# Patient Record
Sex: Female | Born: 1970 | Race: White | Hispanic: No | Marital: Married | Smoking: Former smoker
Health system: Southern US, Community
[De-identification: ages and names within clinical notes are randomized; demographics above are authoritative.]

## PROBLEM LIST (undated history)

## (undated) DIAGNOSIS — I82409 Acute embolism and thrombosis of unspecified deep veins of unspecified lower extremity: Secondary | ICD-10-CM

## (undated) DIAGNOSIS — F419 Anxiety disorder, unspecified: Secondary | ICD-10-CM

## (undated) HISTORY — DX: Acute embolism and thrombosis of unspecified deep veins of unspecified lower extremity: I82.409

## (undated) HISTORY — PX: CHOLECYSTECTOMY: SHX55

## (undated) HISTORY — DX: Anxiety disorder, unspecified: F41.9

---

## 1997-09-20 ENCOUNTER — Other Ambulatory Visit: Admission: RE | Admit: 1997-09-20 | Discharge: 1997-09-20 | Payer: Self-pay | Admitting: Obstetrics and Gynecology

## 1998-12-26 ENCOUNTER — Other Ambulatory Visit: Admission: RE | Admit: 1998-12-26 | Discharge: 1998-12-26 | Payer: Self-pay | Admitting: Obstetrics and Gynecology

## 2000-10-11 ENCOUNTER — Other Ambulatory Visit: Admission: RE | Admit: 2000-10-11 | Discharge: 2000-10-11 | Payer: Self-pay | Admitting: Obstetrics and Gynecology

## 2002-01-21 ENCOUNTER — Other Ambulatory Visit: Admission: RE | Admit: 2002-01-21 | Discharge: 2002-01-21 | Payer: Self-pay | Admitting: Obstetrics and Gynecology

## 2002-10-02 ENCOUNTER — Other Ambulatory Visit: Admission: RE | Admit: 2002-10-02 | Discharge: 2002-10-02 | Payer: Self-pay | Admitting: Obstetrics and Gynecology

## 2003-04-02 ENCOUNTER — Other Ambulatory Visit: Admission: RE | Admit: 2003-04-02 | Discharge: 2003-04-02 | Payer: Self-pay | Admitting: Obstetrics and Gynecology

## 2004-02-10 ENCOUNTER — Other Ambulatory Visit: Admission: RE | Admit: 2004-02-10 | Discharge: 2004-02-10 | Payer: Self-pay | Admitting: Obstetrics and Gynecology

## 2004-12-12 ENCOUNTER — Other Ambulatory Visit: Admission: RE | Admit: 2004-12-12 | Discharge: 2004-12-12 | Payer: Self-pay | Admitting: Obstetrics and Gynecology

## 2006-10-28 ENCOUNTER — Observation Stay: Payer: Self-pay | Admitting: General Surgery

## 2006-10-28 ENCOUNTER — Ambulatory Visit: Payer: Self-pay | Admitting: Internal Medicine

## 2007-12-12 ENCOUNTER — Ambulatory Visit: Payer: Self-pay | Admitting: Internal Medicine

## 2007-12-17 ENCOUNTER — Ambulatory Visit: Payer: Self-pay | Admitting: Internal Medicine

## 2007-12-17 ENCOUNTER — Inpatient Hospital Stay: Payer: Self-pay | Admitting: Internal Medicine

## 2007-12-31 ENCOUNTER — Ambulatory Visit: Payer: Self-pay | Admitting: Internal Medicine

## 2008-01-26 ENCOUNTER — Ambulatory Visit: Payer: Self-pay | Admitting: Internal Medicine

## 2008-03-16 ENCOUNTER — Ambulatory Visit: Payer: Self-pay | Admitting: Internal Medicine

## 2008-04-01 ENCOUNTER — Ambulatory Visit: Payer: Self-pay | Admitting: Internal Medicine

## 2008-05-02 ENCOUNTER — Ambulatory Visit: Payer: Self-pay | Admitting: Internal Medicine

## 2008-06-04 IMAGING — CR DG CHOLANGIOGRAM OPERATIVE
1 series · 2 of 2 positions shown · non-contrast
Comparison: none

REASON FOR EXAM: Post-op
COMMENTS:

[Series 1: view not recorded · 0.17mm/px · 2 of 2 slices shown]
[im 1/2]
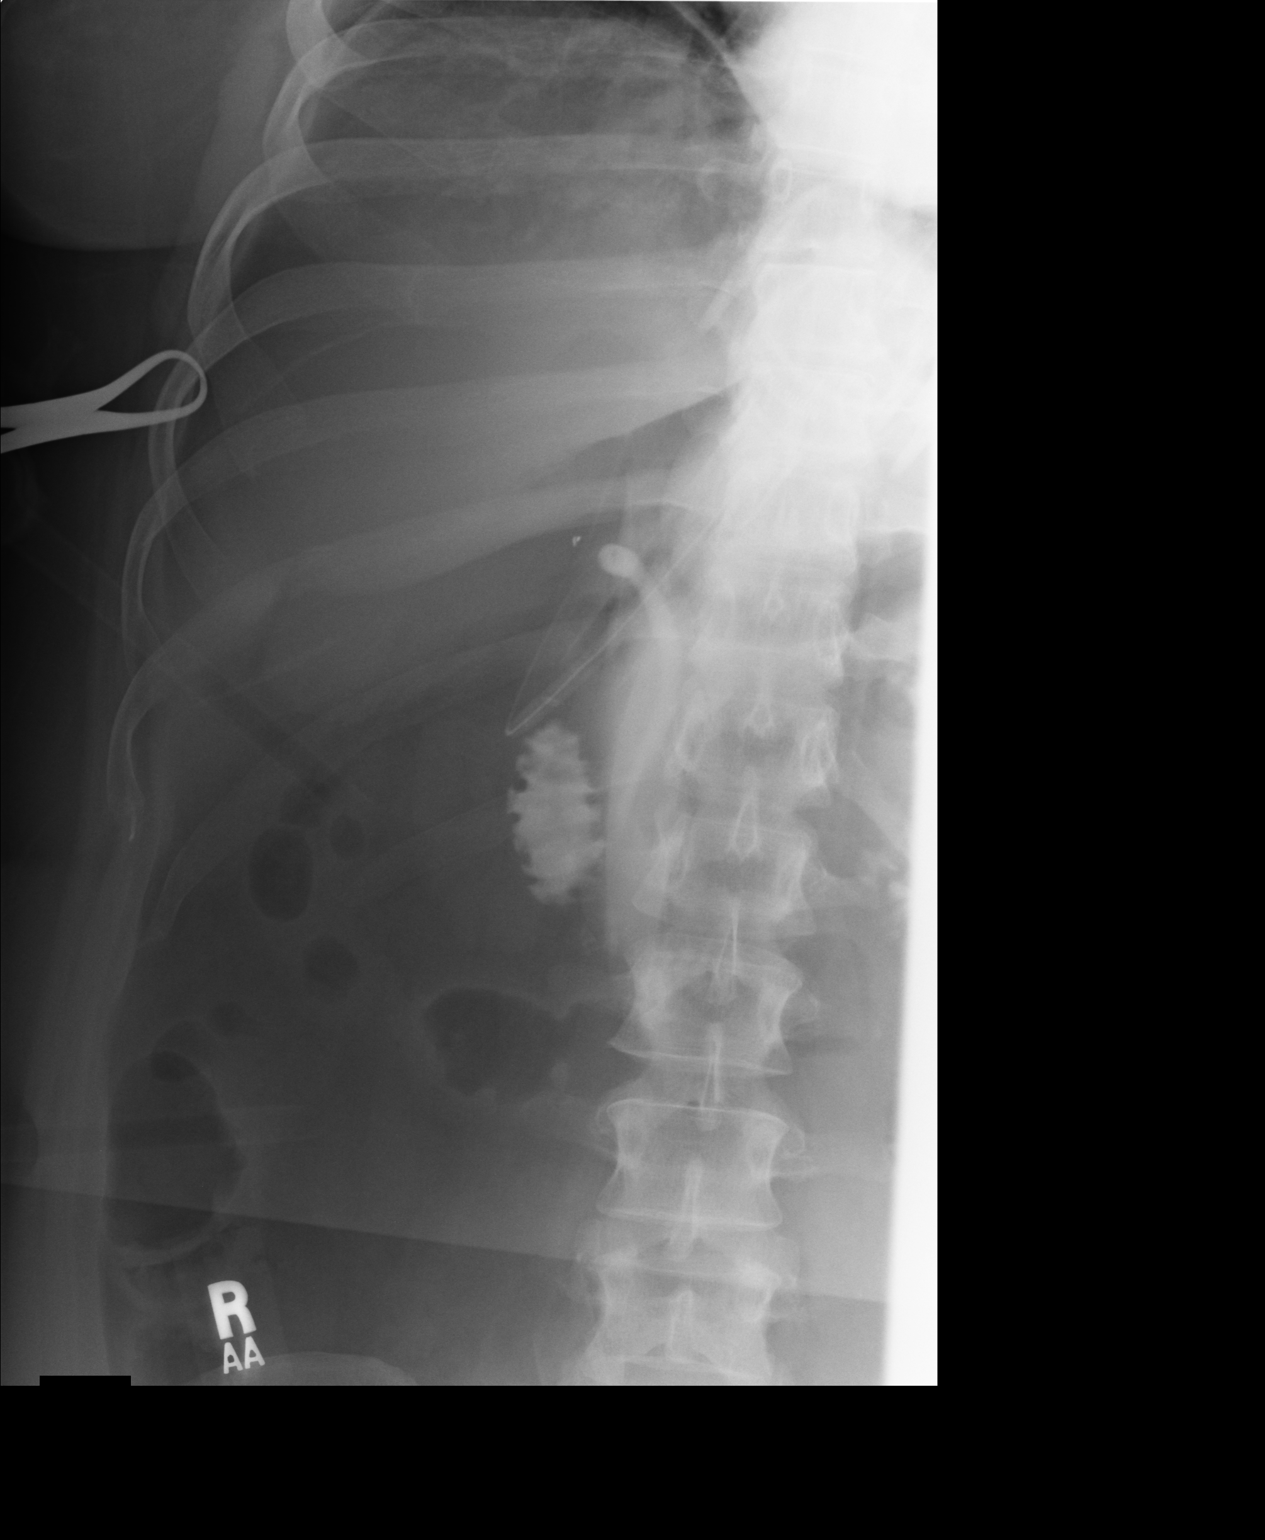
[im 2/2]
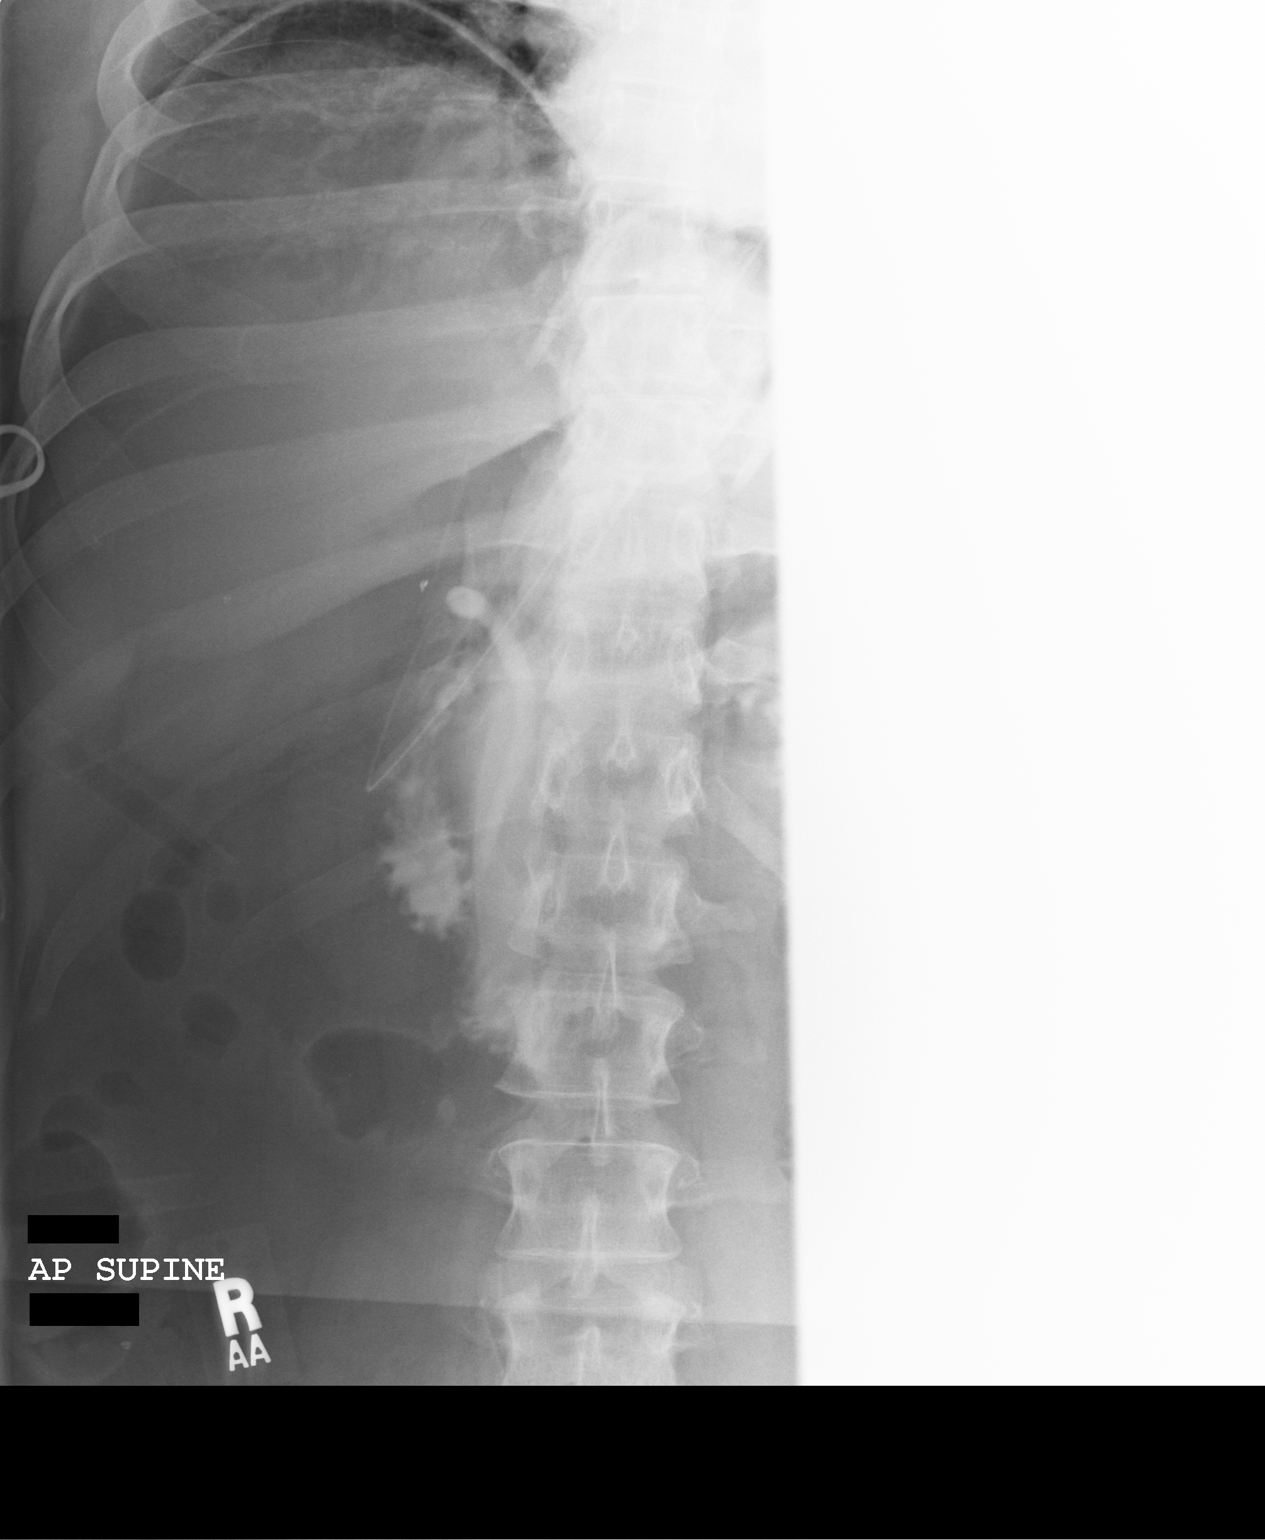

[2 of 2 positions shown; findings below may reference images not displayed]

PROCEDURE:     DXR - DXR CHOLANGIOGRAM OP (INITIAL)  - October 29, 2006 [DATE]

RESULT:     A view of the RIGHT abdomen demonstrates opacification of the
common bile duct with some contrast remaining in the duct and some in the
duodenum. No definite dilatation is seen. No filling defect is evident but
portions of the duct are poorly seen.
IMPRESSION: 1.Please see above.

## 2009-07-18 IMAGING — US US EXTREM LOW VENOUS*R*
1 series · 17 of 24 positions shown · non-contrast
Comparison: none

REASON FOR EXAM: pain swelling eval DVT
COMMENTS:

PROCEDURE:     US  - US DOPPLER LOW EXTR RIGHT  - December 12, 2007  [DATE]
RESULT:     Comparison: No available comparison exam.

[Series 1: us extrem low venous*right* · 17 of 25 slices shown]
[im 1/25]
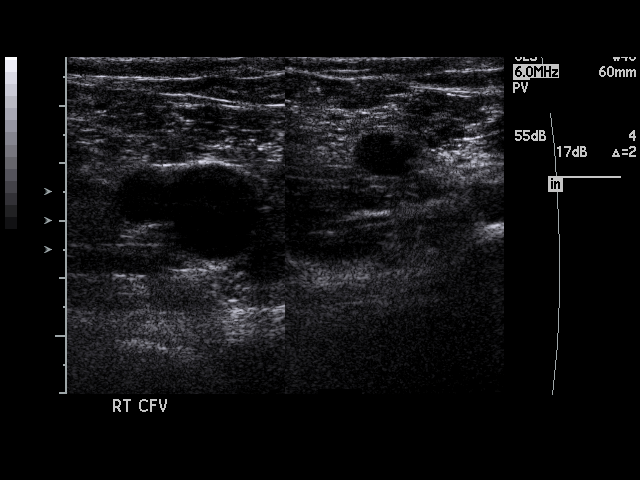
[im 3/25]
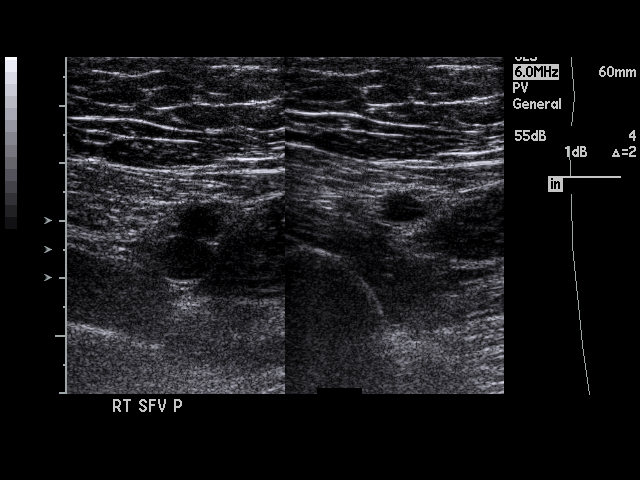
[im 4/25]
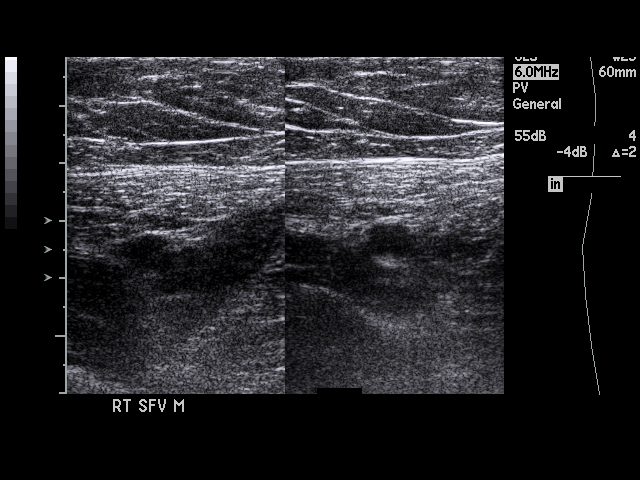
[im 5/25]
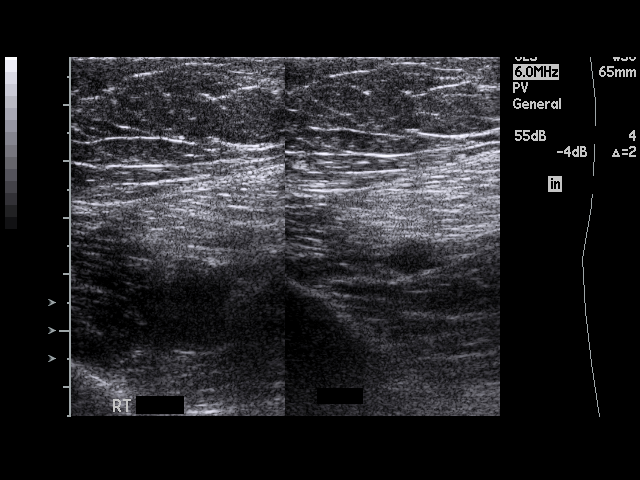
[im 7/25]
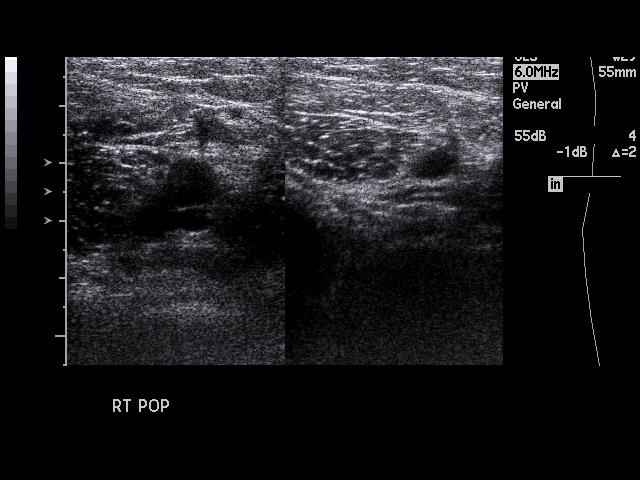
[im 8/25]
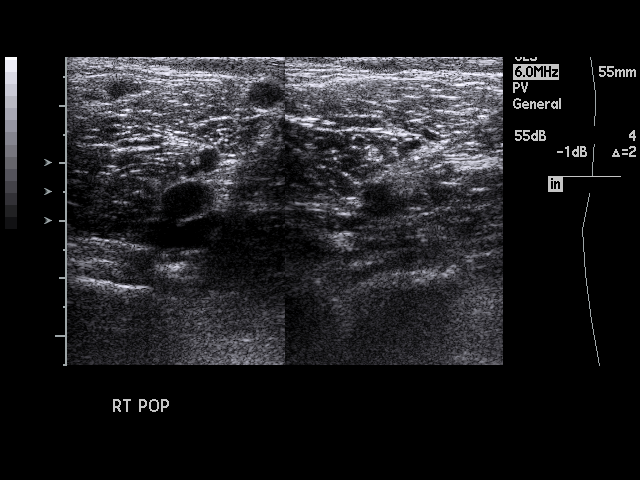
[im 10/25]
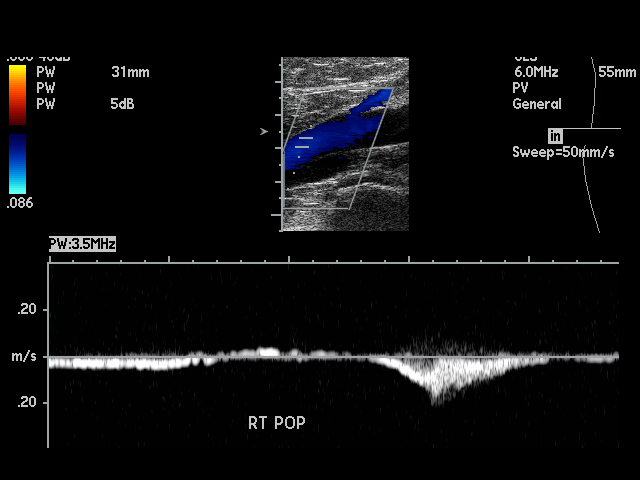
[im 11/25]
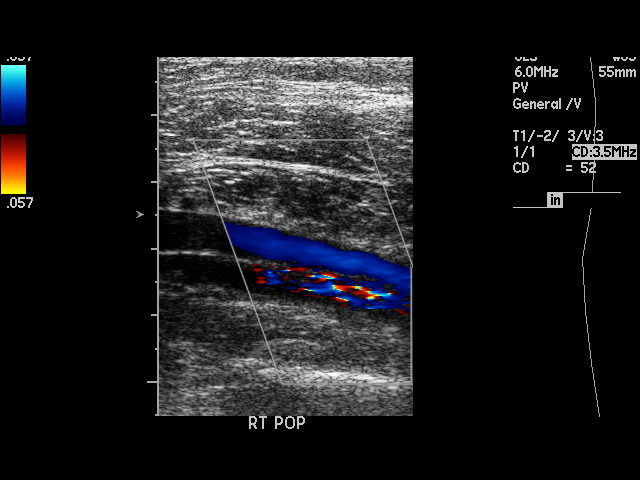
[im 13/25]
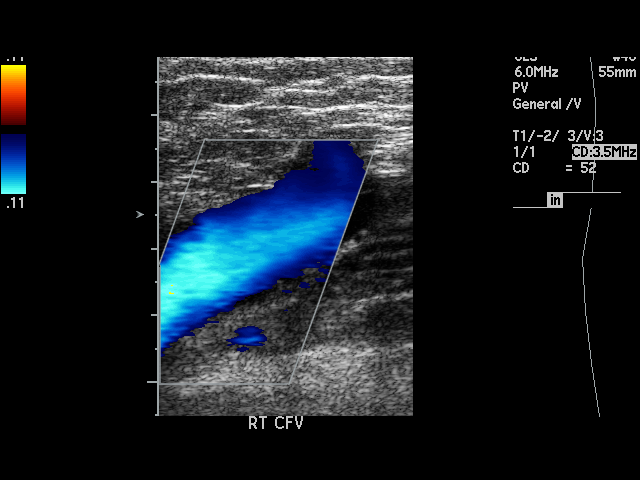
[im 14/25]
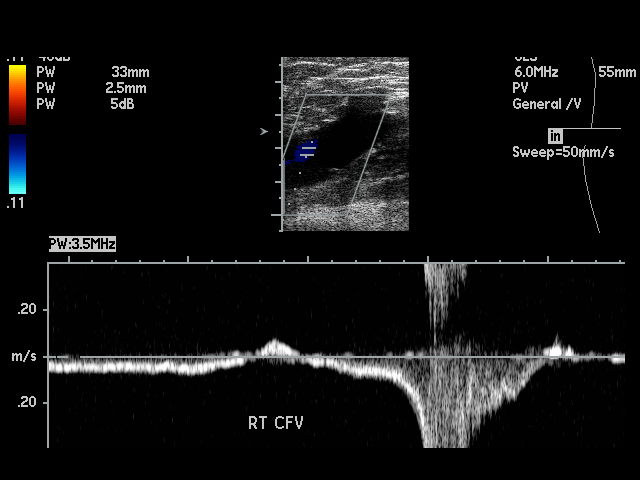
[im 15/25]
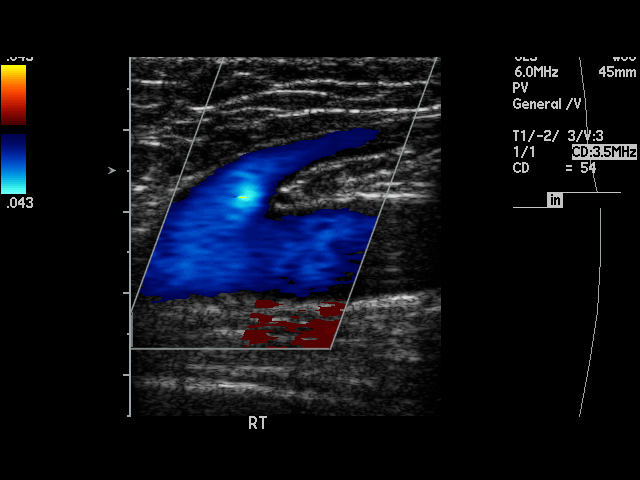
[im 17/25]
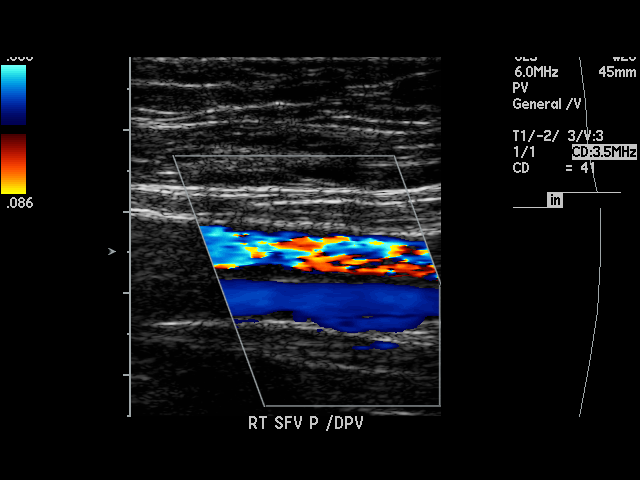
[im 18/25]
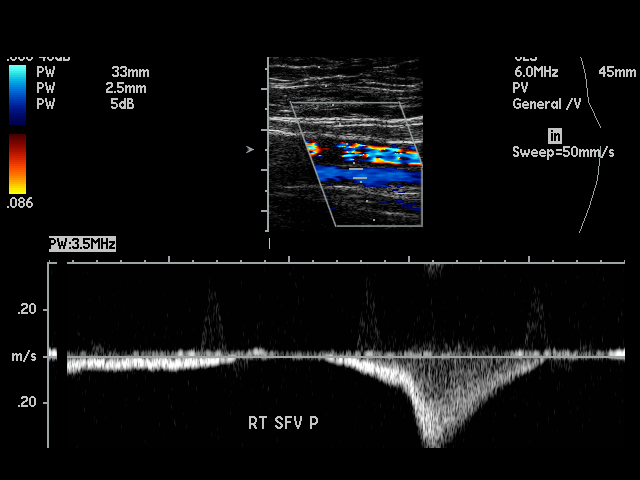
[im 20/25]
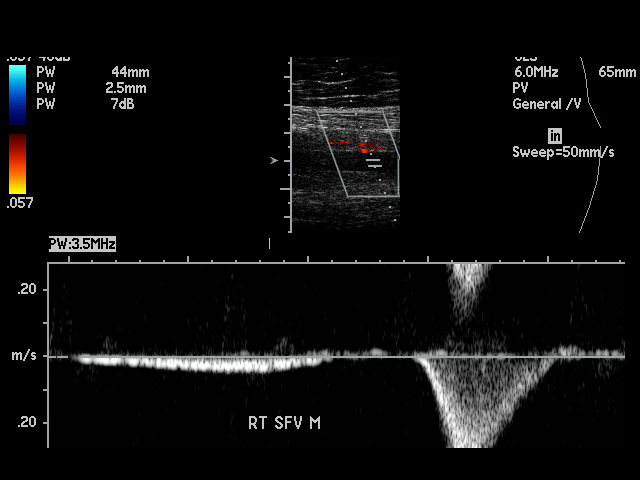
[im 21/25]
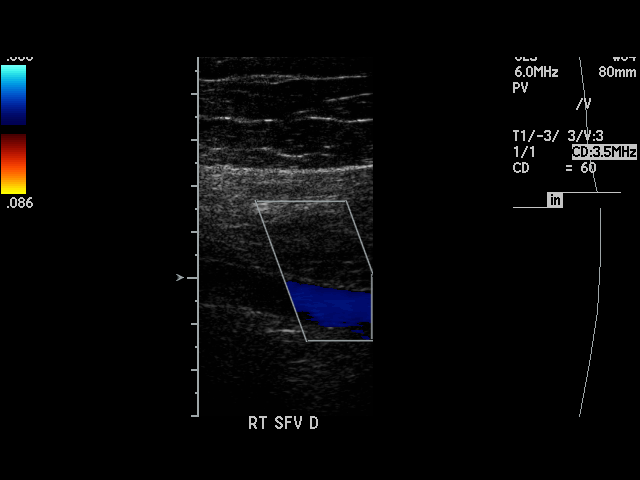
[im 22/25]
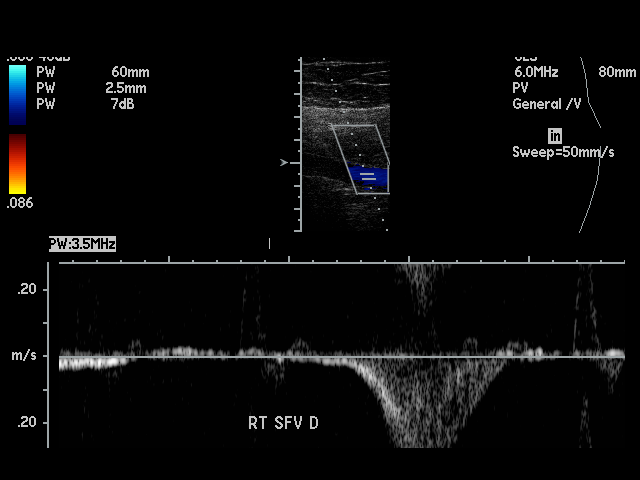
[im 25/25]
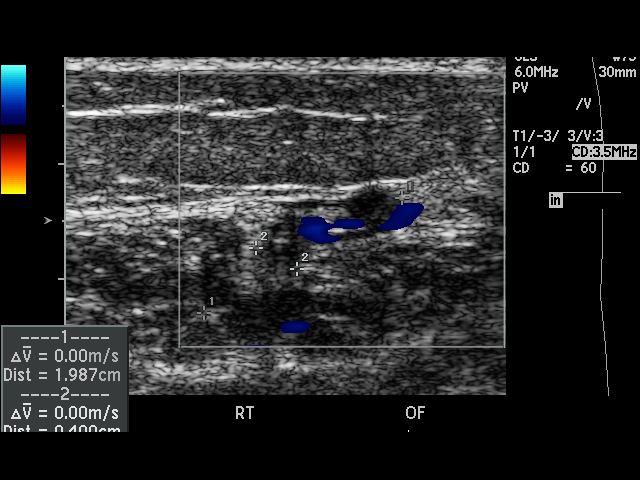

[17 of 24 positions shown; findings below may reference images not displayed]

FINDINGS: Venous Doppler examination of the right lower extremity was performed from
the groin to the popliteal vein. No evidence of DVT. There is nonocclusive
thrombus of a varicose vein in the medial posterior right upper calf at
patient's site of pain.
IMPRESSION: 1. No evidence of right lower extremity deep venous thrombosis. There is
nonocclusive thrombus of a varicose vein in the medial posterior right upper
calf at patient's site of pain.

## 2009-07-23 IMAGING — US US EXTREM LOW VENOUS*R*
1 series · 17 of 24 positions shown · non-contrast
Comparison: none

REASON FOR EXAM: Non-occulsive thrombus
COMMENTS:

[Series 1: us extrem low venous*right* · 17 of 37 slices shown]
[im 1/37]
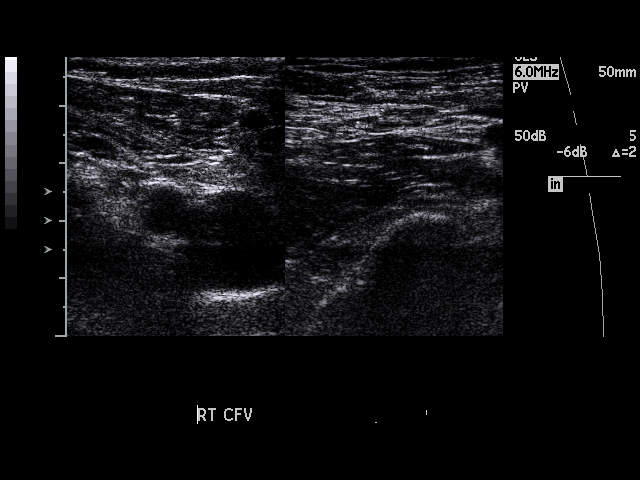
[im 4/37]
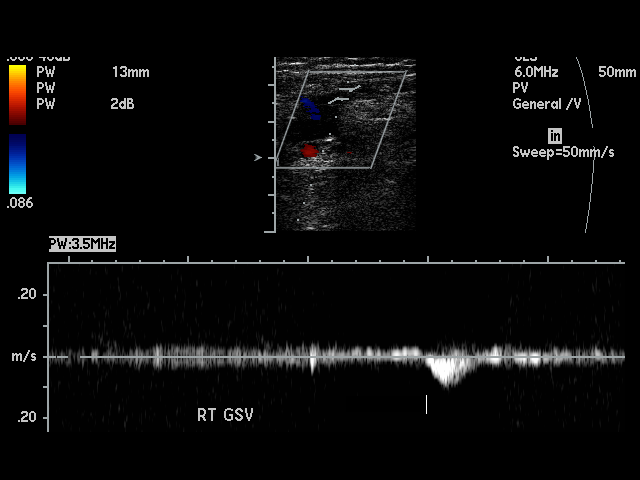
[im 5/37]
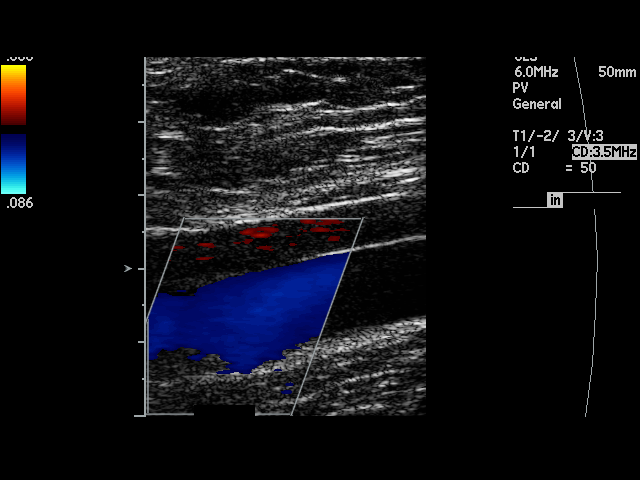
[im 7/37]
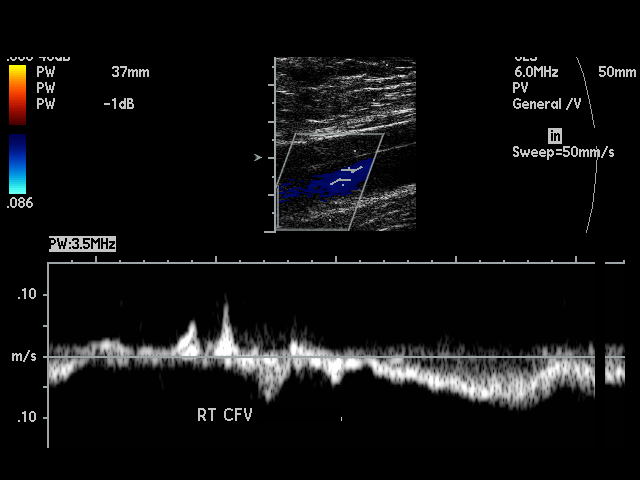
[im 10/37]
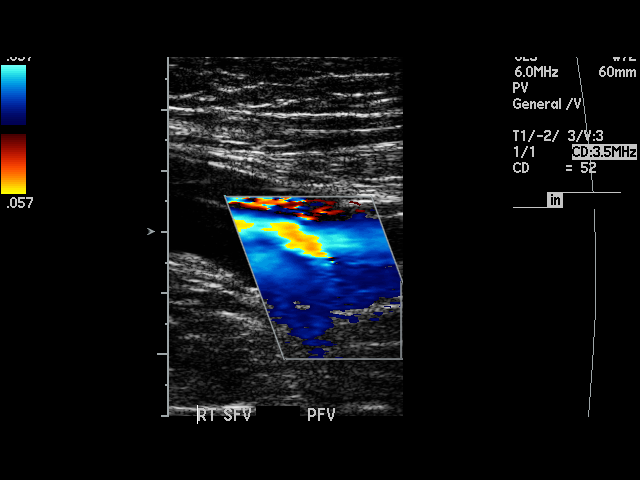
[im 11/37]
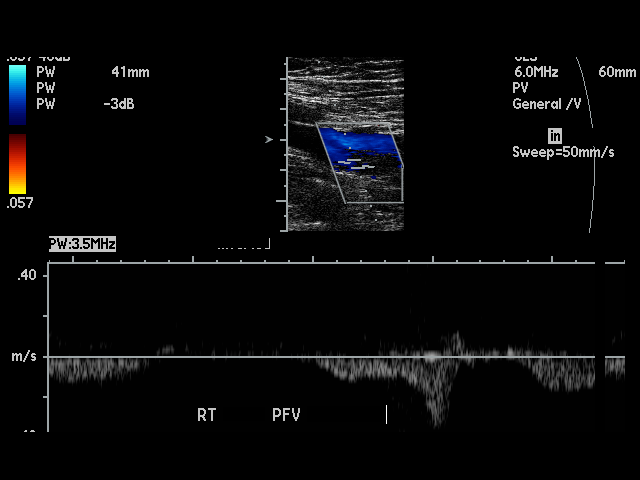
[im 15/37]
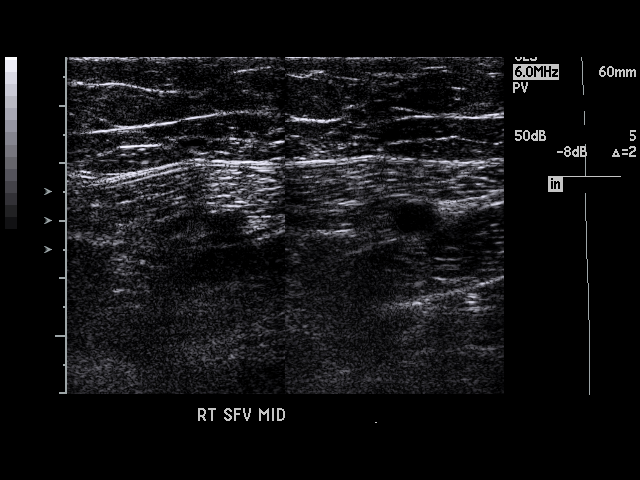
[im 16/37]
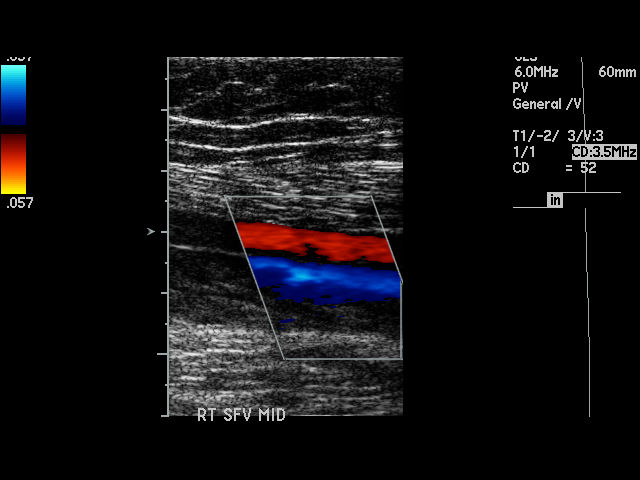
[im 19/37]
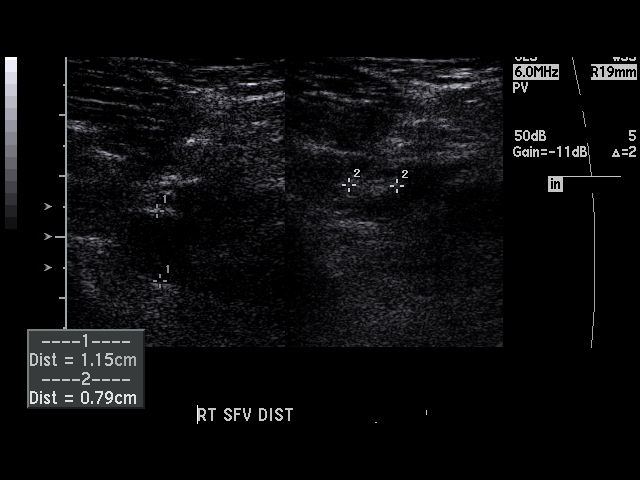
[im 21/37]
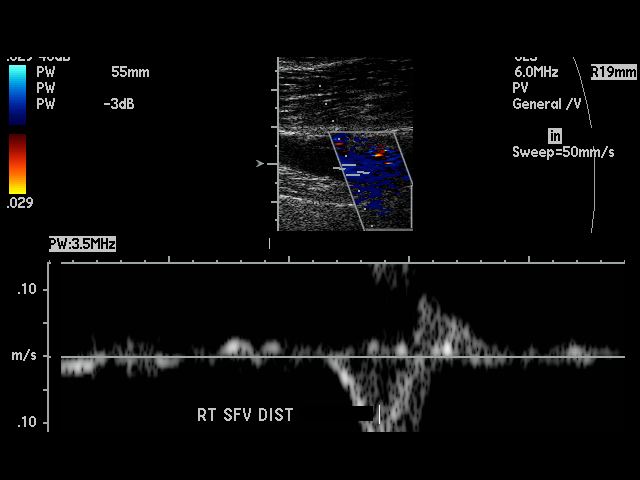
[im 22/37]
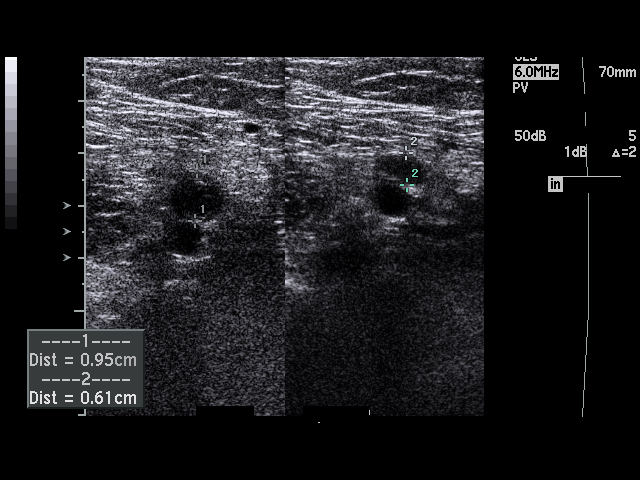
[im 26/37]
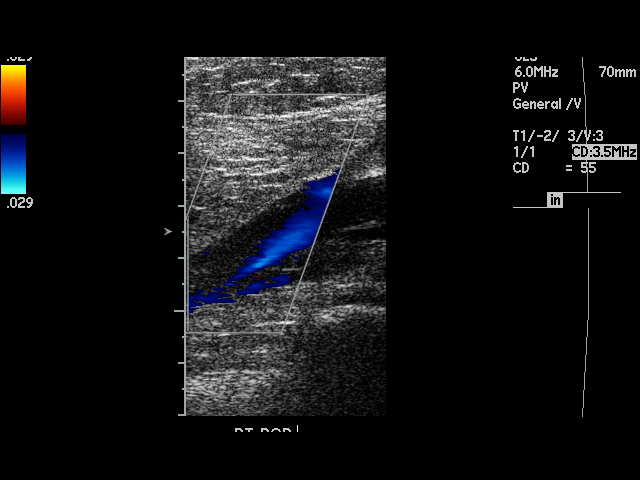
[im 27/37]
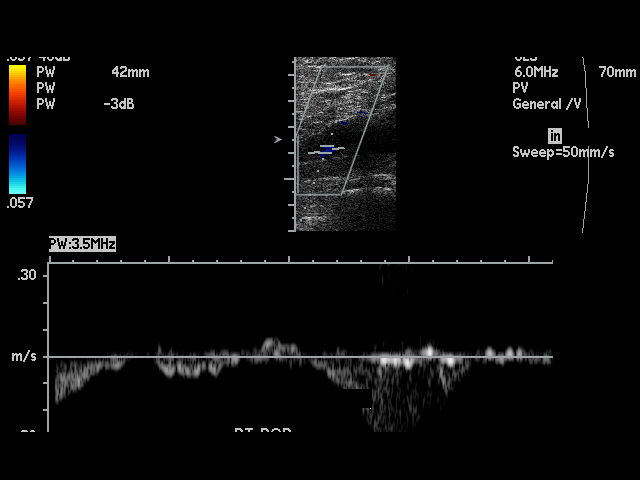
[im 30/37]
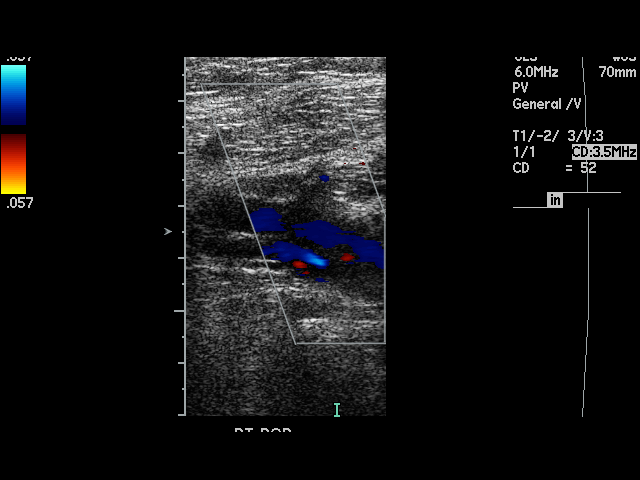
[im 32/37]
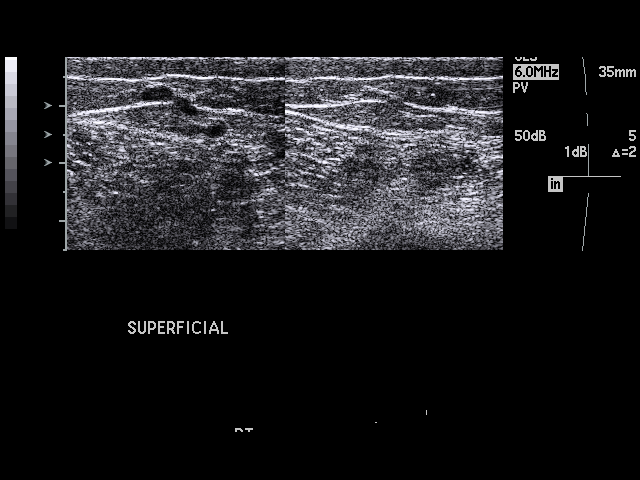
[im 33/37]
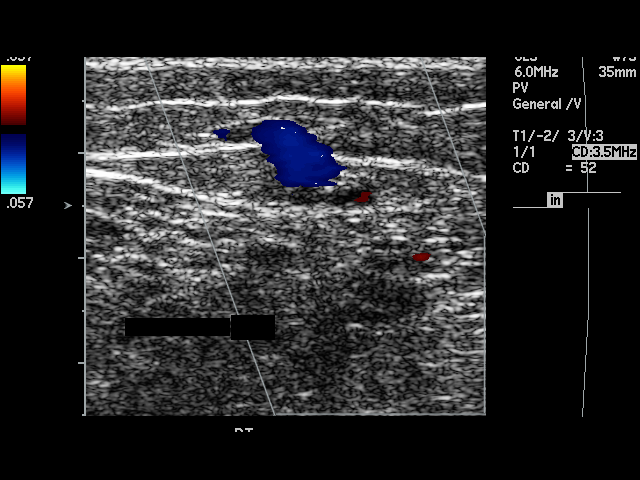
[im 37/37]
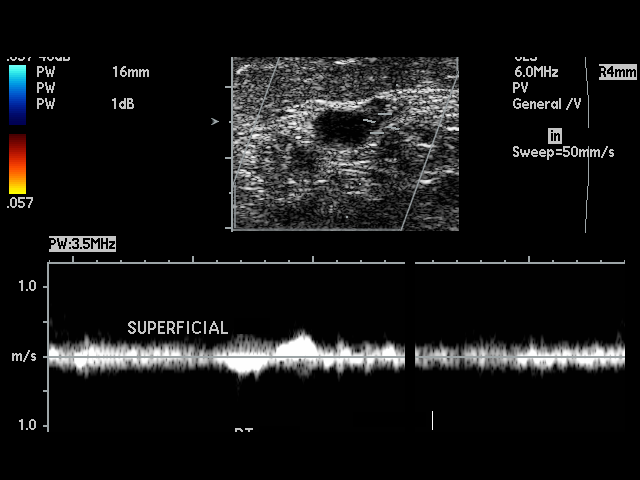

[17 of 24 positions shown; findings below may reference images not displayed]

PROCEDURE:     US  - US DOPPLER LOW EXTR RIGHT  - December 17, 2007 [DATE]

RESULT:     On the RIGHT, there is loss of compressibility of the popliteal
artery compatible with deep vein thrombosis that has developed in the
interval since the prior exam of 12/12/2007. Doppler examination shows the
presence of some flow through the area indicating the thrombus is not
occlusive at this point. No additional thrombus on the RIGHT is seen. The
superficial femoral and common femoral are patent.

Thrombus is again noted in a superficial varicose vein of the RIGHT calf.
IMPRESSION: 1.  There is non-occlusive deep vein thrombosis involving the popliteal
vein.
2.  Thrombus is again noted in a superficial varicosity of the RIGHT calf.

## 2011-04-21 ENCOUNTER — Emergency Department (HOSPITAL_COMMUNITY)
Admission: EM | Admit: 2011-04-21 | Discharge: 2011-04-21 | Disposition: A | Discharge status: Home / Self Care | Payer: Self-pay | Attending: Emergency Medicine | Admitting: Emergency Medicine

## 2011-04-21 DIAGNOSIS — R10819 Abdominal tenderness, unspecified site: Secondary | ICD-10-CM | POA: Insufficient documentation

## 2011-04-21 DIAGNOSIS — R300 Dysuria: Secondary | ICD-10-CM | POA: Insufficient documentation

## 2011-04-21 DIAGNOSIS — N390 Urinary tract infection, site not specified: Secondary | ICD-10-CM | POA: Insufficient documentation

## 2011-04-21 LAB — URINE MICROSCOPIC-ADD ON

## 2011-04-21 LAB — URINALYSIS, ROUTINE W REFLEX MICROSCOPIC
Protein, ur: 30 mg/dL — AB
Specific Gravity, Urine: 1.017 (ref 1.005–1.030)
Urobilinogen, UA: 1 mg/dL (ref 0.0–1.0)

## 2017-10-03 DIAGNOSIS — F419 Anxiety disorder, unspecified: Secondary | ICD-10-CM | POA: Insufficient documentation

## 2017-10-03 DIAGNOSIS — E669 Obesity, unspecified: Secondary | ICD-10-CM | POA: Insufficient documentation

## 2017-10-03 DIAGNOSIS — I82409 Acute embolism and thrombosis of unspecified deep veins of unspecified lower extremity: Secondary | ICD-10-CM | POA: Insufficient documentation

## 2017-10-03 DIAGNOSIS — E7800 Pure hypercholesterolemia, unspecified: Secondary | ICD-10-CM | POA: Insufficient documentation

## 2020-02-22 ENCOUNTER — Ambulatory Visit
Admission: RE | Admit: 2020-02-22 | Discharge: 2020-02-22 | Disposition: A | Discharge status: Home / Self Care | Payer: No Typology Code available for payment source | Source: Ambulatory Visit | Attending: Physician Assistant | Admitting: Physician Assistant

## 2020-02-22 ENCOUNTER — Other Ambulatory Visit: Payer: Self-pay

## 2020-02-22 ENCOUNTER — Other Ambulatory Visit: Payer: Self-pay | Admitting: Physician Assistant

## 2020-02-22 DIAGNOSIS — M79604 Pain in right leg: Secondary | ICD-10-CM

## 2020-03-21 ENCOUNTER — Encounter (INDEPENDENT_AMBULATORY_CARE_PROVIDER_SITE_OTHER): Payer: Self-pay | Admitting: Vascular Surgery

## 2020-03-28 ENCOUNTER — Other Ambulatory Visit: Payer: Self-pay

## 2020-03-28 ENCOUNTER — Encounter (INDEPENDENT_AMBULATORY_CARE_PROVIDER_SITE_OTHER): Payer: Self-pay | Admitting: Vascular Surgery

## 2020-03-28 ENCOUNTER — Ambulatory Visit (INDEPENDENT_AMBULATORY_CARE_PROVIDER_SITE_OTHER): Payer: No Typology Code available for payment source | Admitting: Vascular Surgery

## 2020-03-28 VITALS — BP 120/73 | HR 54 | Resp 16 | Ht 64.5 in | Wt 235.8 lb

## 2020-03-28 DIAGNOSIS — E7800 Pure hypercholesterolemia, unspecified: Secondary | ICD-10-CM

## 2020-03-28 DIAGNOSIS — I872 Venous insufficiency (chronic) (peripheral): Secondary | ICD-10-CM | POA: Diagnosis not present

## 2020-03-28 DIAGNOSIS — I82501 Chronic embolism and thrombosis of unspecified deep veins of right lower extremity: Secondary | ICD-10-CM | POA: Diagnosis not present

## 2020-04-01 ENCOUNTER — Encounter (INDEPENDENT_AMBULATORY_CARE_PROVIDER_SITE_OTHER): Payer: Self-pay | Admitting: Vascular Surgery

## 2020-04-01 DIAGNOSIS — I872 Venous insufficiency (chronic) (peripheral): Secondary | ICD-10-CM | POA: Insufficient documentation

## 2020-04-01 NOTE — Progress Notes (Signed)
MRN : [id number]  Anita Carroll is a 49 y.o. (1970-10-30) female who presents with chief complaint of  Chief Complaint  Patient presents with  . New Patient (Initial Visit)    ref [place] rle swelling  .  History of Present Illness:   The patient presents to the office for evaluation of DVT.  DVT was identified at [facility] by Duplex ultrasound remotely.  The initial symptoms were pain and swelling in the lower extremity.  She notes she has had both DVT and STP of the right leg and she is certain she is Factor V Leiden +No family history of bleeding/clotting disorders, porphyria or autoimmune disease   The patient notes the leg continues to be very painful with dependency and swells quite a bite.  Symptoms are much better with elevation.  The patient notes minimal edema in the morning which steadily worsens throughout the day.    The patient has not been using compression therapy at this point.  No SOB or pleuritic chest pains.  No cough or hemoptysis.  No blood per rectum or blood in any sputum.  No excessive bruising per the patient.   Current Meds  Medication Sig  . ALPRAZolam (XANAX) 0.25 MG tablet Take 0.25 mg by mouth 2 (two) times daily as needed.    Past Medical History:  Diagnosis Date  . Anxiety   . DVT (deep venous thrombosis) (HCC)       Social History Social History   Tobacco Use  . Smoking status: Former Games developer  . Smokeless tobacco: Never Used  Substance Use Topics  . Alcohol use: Not Currently  . Drug use: Never    Family History Family History  Problem Relation Age of Onset  . Breast cancer Mother   . Thyroid disease Mother   . Diabetes Father   . Thyroid disease Daughter   No family history of bleeding/clotting disorders, porphyria or autoimmune disease   Allergies  Allergen Reactions  . Penicillin G Nausea Only     REVIEW OF SYSTEMS (Negative unless checked)  Constitutional: [] Weight loss  [] Fever  [] Chills Cardiac: [] Chest pain    [] Chest pressure   [] Palpitations   [] Shortness of breath when laying flat   [] Shortness of breath with exertion. Vascular:  [] Pain in legs with walking   [x] Pain in legs at rest  [x] History of DVT   [x] Phlebitis   [x] Swelling in legs   [] Varicose veins   [] Non-healing ulcers Pulmonary:   [] Uses home oxygen   [] Productive cough   [] Hemoptysis   [] Wheeze  [] COPD   [] Asthma Neurologic:  [] Dizziness   [] Seizures   [] History of stroke   [] History of TIA  [] Aphasia   [] Vissual changes   [] Weakness or numbness in arm   [] Weakness or numbness in leg Musculoskeletal:   [] Joint swelling   [] Joint pain   [] Low back pain Hematologic:  [] Easy bruising  [] Easy bleeding   [x] Hypercoagulable state   [] Anemic Gastrointestinal:  [] Diarrhea   [] Vomiting  [] Gastroesophageal reflux/heartburn   [] Difficulty swallowing. Genitourinary:  [] Chronic kidney disease   [] Difficult urination  [] Frequent urination   [] Blood in urine Skin:  [] Rashes   [] Ulcers  Psychological:  [] History of anxiety   []  History of major depression.  Physical Examination  Vitals:   03/28/20 1112  BP: 120/73  Pulse: (!) 54  Resp: 16  Weight: 235 lb 12.8 oz (107 kg)  Height: 5' 4.5" (1.638 m)   Body mass index is 39.85 kg/m. Gen: WD/WN, NAD  Head: [place]/AT, No temporalis wasting.  Ear/Nose/Throat: Hearing grossly intact, nares w/o erythema or drainage, poor dentition Eyes: PER, EOMI, sclera nonicteric.  Neck: Supple, no masses.  No bruit or JVD.  Pulmonary:  Good air movement, clear to auscultation bilaterally, no use of accessory muscles.  Cardiac: RRR, normal S1, S2, no Murmurs. Vascular: scattered varicosities present bilaterally.  Mild venous stasis changes to the legs bilaterally.  2+ soft pitting edema right leg. Vessel Right Left  Radial Palpable Palpable  PT Palpable Palpable  DP Palpable Palpable  Gastrointestinal: soft, non-distended. No guarding/no peritoneal signs.  Musculoskeletal: M/S 5/5 throughout.  No deformity or  atrophy.  Neurologic: CN 2-12 intact. Pain and light touch intact in extremities.  Symmetrical.  Speech is fluent. Motor exam as listed above. Psychiatric: Judgment intact, Mood & affect appropriate for pt's clinical situation. Dermatologic: Mild venous rashes no ulcers noted.  No changes consistent with cellulitis. Lymph : No Cervical lymphadenopathy, no lichenification or skin changes of chronic lymphedema.  CBC No results found for: WBC, HGB, HCT, MCV, PLT  BMET No results found for: NA, K, CL, CO2, GLUCOSE, BUN, CREATININE, CALCIUM, GFRNONAA, GFRAA CrCl cannot be calculated (No successful lab value found.).  COAG No results found for: INR, PROTIME  Radiology No results found.    Assessment/Plan 1. Chronic venous insufficiency Recommend:   No surgery or intervention at this point in time.  IVC filter is not indicated at present.  Patient's duplex ultrasound of the venous system shows DVT from the popliteal to the femoral veins.  The patient is initiated on anticoagulation   Elevation was stressed, use of a recliner was discussed.  I have had a long discussion with the patient regarding DVT and post phlebitic changes such as swelling and why it  causes symptoms such as pain.  The patient will wear graduated compression stockings class 1 (20-30 mmHg), beginning after three full days of anticoagulation, on a daily basis a prescription was given. The patient will  beginning wearing the stockings first thing in the morning and removing them in the evening. The patient is instructed specifically not to sleep in the stockings.  In addition, behavioral modification including elevation during the day and avoidance of prolonged dependency will be initiated.    The patient will continue anticoagulation for now as there have not been any problems or complications at this point.    2. Chronic deep vein thrombosis (DVT) of right lower extremity, unspecified vein (HCC) Recommend:   No  surgery or intervention at this point in time.  IVC filter is not indicated at present.  Patient's duplex ultrasound of the venous system shows DVT from the popliteal to the femoral veins.  The patient is initiated on anticoagulation   Elevation was stressed, use of a recliner was discussed.  I have had a long discussion with the patient regarding DVT and post phlebitic changes such as swelling and why it  causes symptoms such as pain.  The patient will wear graduated compression stockings class 1 (20-30 mmHg), beginning after three full days of anticoagulation, on a daily basis a prescription was given. The patient will  beginning wearing the stockings first thing in the morning and removing them in the evening. The patient is instructed specifically not to sleep in the stockings.  In addition, behavioral modification including elevation during the day and avoidance of prolonged dependency will be initiated.    The patient will continue anticoagulation for now as there have not been any problems or  complications at this point.   - VAS [place] LOWER EXTREMITY VENOUS (DVT); Future  3. Pure hypercholesterolemia Continue statin as ordered and reviewed, no changes at this time    Levora Dredge, MD  04/01/2020 4:39 PM

## 2020-04-14 ENCOUNTER — Encounter (INDEPENDENT_AMBULATORY_CARE_PROVIDER_SITE_OTHER): Payer: No Typology Code available for payment source

## 2020-04-14 ENCOUNTER — Ambulatory Visit (INDEPENDENT_AMBULATORY_CARE_PROVIDER_SITE_OTHER): Payer: No Typology Code available for payment source | Admitting: Vascular Surgery

## 2020-05-12 ENCOUNTER — Encounter (INDEPENDENT_AMBULATORY_CARE_PROVIDER_SITE_OTHER): Payer: No Typology Code available for payment source

## 2020-05-12 ENCOUNTER — Ambulatory Visit (INDEPENDENT_AMBULATORY_CARE_PROVIDER_SITE_OTHER): Payer: No Typology Code available for payment source | Admitting: Vascular Surgery

## 2020-05-31 ENCOUNTER — Other Ambulatory Visit (INDEPENDENT_AMBULATORY_CARE_PROVIDER_SITE_OTHER): Payer: Self-pay | Admitting: Vascular Surgery

## 2020-05-31 DIAGNOSIS — I872 Venous insufficiency (chronic) (peripheral): Secondary | ICD-10-CM

## 2020-05-31 DIAGNOSIS — I82501 Chronic embolism and thrombosis of unspecified deep veins of right lower extremity: Secondary | ICD-10-CM

## 2020-06-02 ENCOUNTER — Ambulatory Visit (INDEPENDENT_AMBULATORY_CARE_PROVIDER_SITE_OTHER): Payer: No Typology Code available for payment source | Admitting: Vascular Surgery

## 2020-06-02 ENCOUNTER — Ambulatory Visit (INDEPENDENT_AMBULATORY_CARE_PROVIDER_SITE_OTHER): Payer: No Typology Code available for payment source

## 2020-06-02 ENCOUNTER — Encounter (INDEPENDENT_AMBULATORY_CARE_PROVIDER_SITE_OTHER): Payer: Self-pay | Admitting: Vascular Surgery

## 2020-06-02 ENCOUNTER — Other Ambulatory Visit: Payer: Self-pay

## 2020-06-02 VITALS — BP 126/76 | HR 62 | Ht 65.0 in | Wt 231.0 lb

## 2020-06-02 DIAGNOSIS — E7800 Pure hypercholesterolemia, unspecified: Secondary | ICD-10-CM

## 2020-06-02 DIAGNOSIS — I82501 Chronic embolism and thrombosis of unspecified deep veins of right lower extremity: Secondary | ICD-10-CM

## 2020-06-02 DIAGNOSIS — I872 Venous insufficiency (chronic) (peripheral): Secondary | ICD-10-CM

## 2020-06-02 NOTE — Progress Notes (Signed)
MRN : [id number]  Anita Carroll is a 49 y.o. (1970/12/20) female who presents with chief complaint of  Chief Complaint  Patient presents with  . Follow-up    pt conv. U/S Follow up  .  History of Present Illness:   The patient presents to the office for follow up evaluation of DVT.  DVT was identified at [facility] by Duplex ultrasound remotely.  The initial symptoms were pain and swelling in the lower extremity.  She notes she has had both DVT and STP of the right leg and she is certain she is Factor V Leiden +No family history of bleeding/clotting disorders, porphyria or autoimmune disease   The patient notes the leg continues to be very painful with dependency and swells quite a bite.  Symptoms are much better with elevation.  The patient notes minimal edema in the morning which steadily worsens throughout the day.    The patient has not been using compression therapy at this point.  No SOB or pleuritic chest pains.  No cough or hemoptysis.  No blood per rectum or blood in any sputum.  No excessive bruising per the patient.   Duplex ultrasound today is negative for DVT or STP but extensive reflux in the GSV is noted  Current Meds  Medication Sig  . ALPRAZolam (XANAX) 0.25 MG tablet Take 0.25 mg by mouth 2 (two) times daily as needed.    Past Medical History:  Diagnosis Date  . Anxiety   . DVT (deep venous thrombosis) (HCC)     Past Surgical History:  Procedure Laterality Date  . CHOLECYSTECTOMY      Social History Social History   Tobacco Use  . Smoking status: Former Games developer  . Smokeless tobacco: Never Used  Substance Use Topics  . Alcohol use: Not Currently  . Drug use: Never    Family History Family History  Problem Relation Age of Onset  . Breast cancer Mother   . Thyroid disease Mother   . Diabetes Father   . Thyroid disease Daughter     Allergies  Allergen Reactions  . Penicillin G Nausea Only     REVIEW OF SYSTEMS (Negative unless  checked)  Constitutional: [] Weight loss  [] Fever  [] Chills Cardiac: [] Chest pain   [] Chest pressure   [] Palpitations   [] Shortness of breath when laying flat   [] Shortness of breath with exertion. Vascular:  [] Pain in legs with walking   [] Pain in legs at rest  [x] History of DVT   [x] Phlebitis   [x] Swelling in legs   [x] Varicose veins   [] Non-healing ulcers Pulmonary:   [] Uses home oxygen   [] Productive cough   [] Hemoptysis   [] Wheeze  [] COPD   [] Asthma Neurologic:  [] Dizziness   [] Seizures   [] History of stroke   [] History of TIA  [] Aphasia   [] Vissual changes   [] Weakness or numbness in arm   [] Weakness or numbness in leg Musculoskeletal:   [] Joint swelling   [] Joint pain   [] Low back pain Hematologic:  [] Easy bruising  [] Easy bleeding   [x] Hypercoagulable state   [] Anemic Gastrointestinal:  [] Diarrhea   [] Vomiting  [] Gastroesophageal reflux/heartburn   [] Difficulty swallowing. Genitourinary:  [] Chronic kidney disease   [] Difficult urination  [] Frequent urination   [] Blood in urine Skin:  [] Rashes   [] Ulcers  Psychological:  [] History of anxiety   []  History of major depression.  Physical Examination  Vitals:   06/02/20 1438  BP: 126/76  Pulse: 62  Weight: 231 lb (104.8 kg)  Height:  5\' 5"  (1.651 m)   Body mass index is 38.44 kg/m. Gen: WD/WN, NAD Head: [place]/AT, No temporalis wasting.  Ear/Nose/Throat: Hearing grossly intact, nares w/o erythema or drainage Eyes: PER, EOMI, sclera nonicteric.  Neck: Supple, no large masses.   Pulmonary:  Good air movement, no audible wheezing bilaterally, no use of accessory muscles.  Cardiac: RRR, no JVD Vascular:Large varicosities present extensively greater than 10 mm right.  Mild venous stasis changes to the legs bilaterally.  2+ soft pitting edema Vessel Right Left  Radial Palpable Palpable  PT Palpable Palpable  DP Palpable Palpable  Gastrointestinal: Non-distended. No guarding/no peritoneal signs.  Musculoskeletal: M/S 5/5 throughout.  No  deformity or atrophy.  Neurologic: CN 2-12 intact. Symmetrical.  Speech is fluent. Motor exam as listed above. Psychiatric: Judgment intact, Mood & affect appropriate for pt's clinical situation. Dermatologic: Venous rashes no ulcers noted.  No changes consistent with cellulitis.   CBC No results found for: WBC, HGB, HCT, MCV, PLT  BMET No results found for: NA, K, CL, CO2, GLUCOSE, BUN, CREATININE, CALCIUM, GFRNONAA, GFRAA CrCl cannot be calculated (No successful lab value found.).  COAG No results found for: INR, PROTIME  Radiology No results found.   Assessment/Plan 1. Chronic venous insufficiency Recommend  I have had a long  discussion with the patient regarding  Venous insufficiency, reflux and STP in association with her hypercoagulable state.  I have reviewed whyreflux causes symptoms. Patient will continue  wearing graduated compression stockings class 1 on a daily basis, beginning first thing in the morning and removing them in the evening.    In addition, behavioral modification including elevation during the day was again discussed and this will continue.  The patient has utilized over the counter pain medications and has been exercising.  However, at this time conservative therapy has not alleviated the patient's symptoms of leg pain and swelling.  We discussed laser ablation of the right great saphenous vein to eliminate the symptoms of pain and swelling of the lower extremities caused by the severe superficial venous reflux disease. Risk and benefits were reviewed the patient including the additional possible risks of DVT associated with a hypercoagulable state.  Indications for the procedure were reviewed.  All questions were answered, the patient voices understanding that this is for symptom relief and quality of life and her limb/life is not in danger.  She wishes to consider this option.   A total of 35 minutes was spent with this patient and greater than 50% was  spent in counseling and coordination of care with the patient.  Discussion included the treatment options for vascular disease including indications for surgery and intervention.  Also discussed is the appropriate timing of treatment.  In addition medical therapy was discussed.   2. Chronic deep vein thrombosis (DVT) of right lower extremity, unspecified vein (HCC) See #1  3. Pure hypercholesterolemia Continue statin as ordered and reviewed, no changes at this time     , MD  06/02/2020 2:58 PM

## 2020-06-08 ENCOUNTER — Encounter (INDEPENDENT_AMBULATORY_CARE_PROVIDER_SITE_OTHER): Payer: Self-pay | Admitting: Vascular Surgery

## 2021-09-28 IMAGING — US US EXTREM LOW VENOUS*R*
1 series · 14 of 24 positions shown · non-contrast
Comparison: 12/17/2007

CLINICAL DATA: Right lower extremity pain and swelling.

EXAM:
RIGHT LOWER EXTREMITY VENOUS DOPPLER ULTRASOUND
TECHNIQUE: Gray-scale sonography with compression, as well as color and duplex
ultrasound, were performed to evaluate the deep venous system(s)
from the level of the common femoral vein through the popliteal and
proximal calf veins.

[Series 1: us venous img lower uni right (dvt) · portal-venous · 14 of 26 slices shown]
[im 1/26]
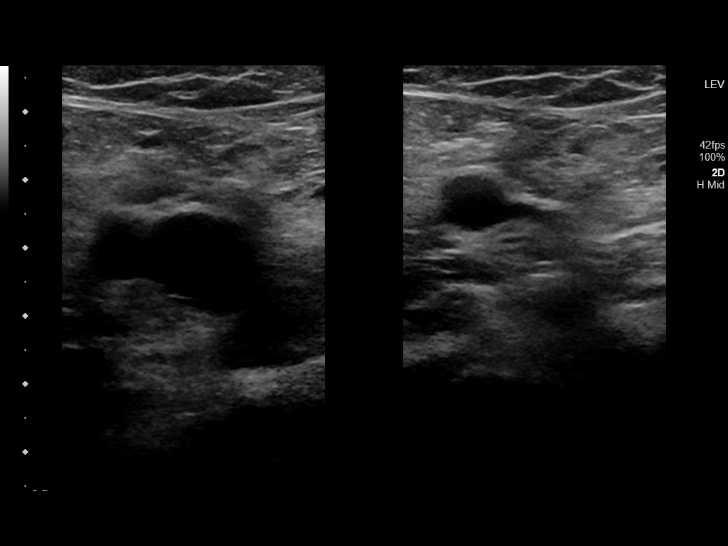
[im 3/26]
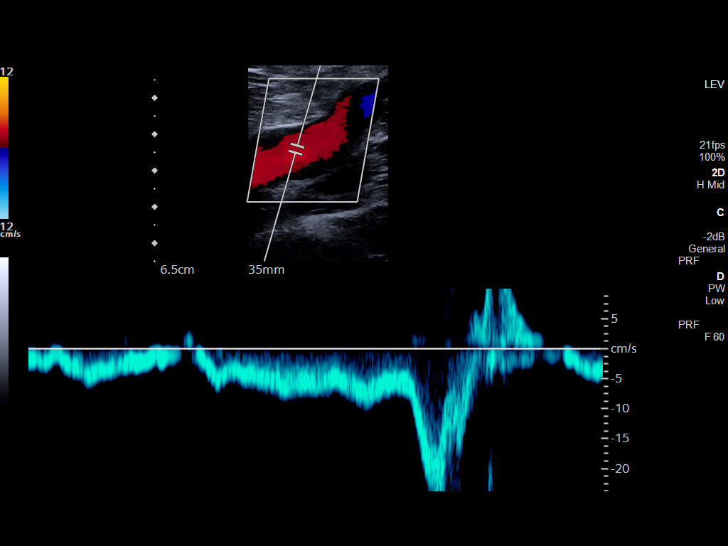
[im 5/26]
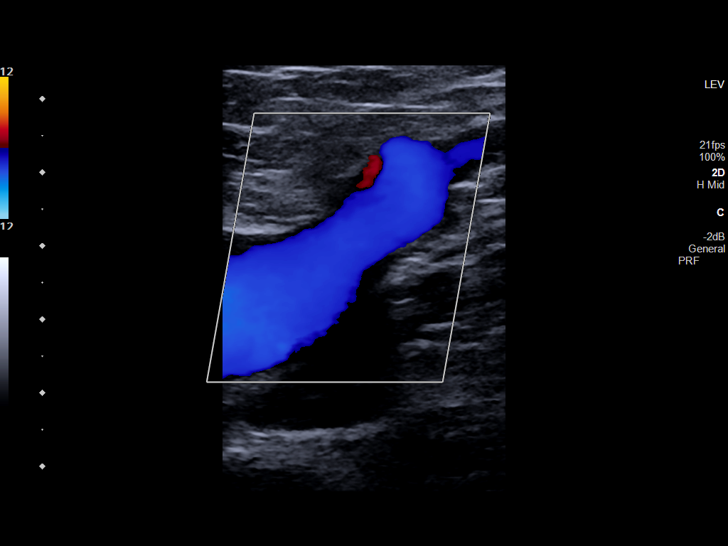
[im 7/26]
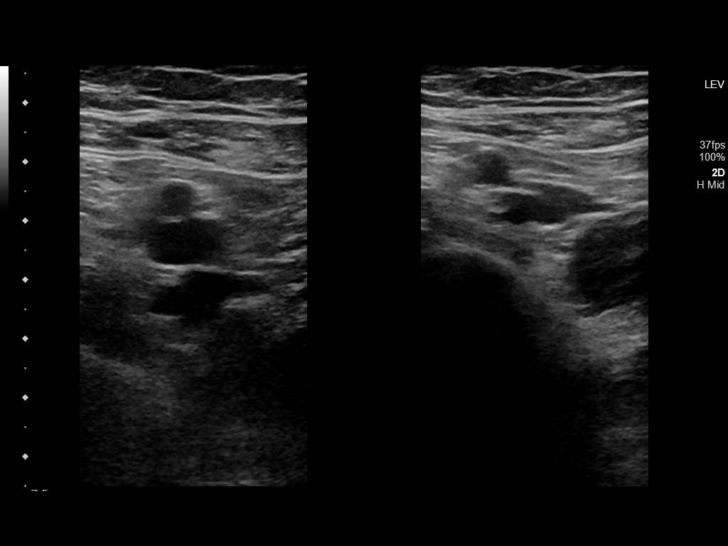
[im 8/26]
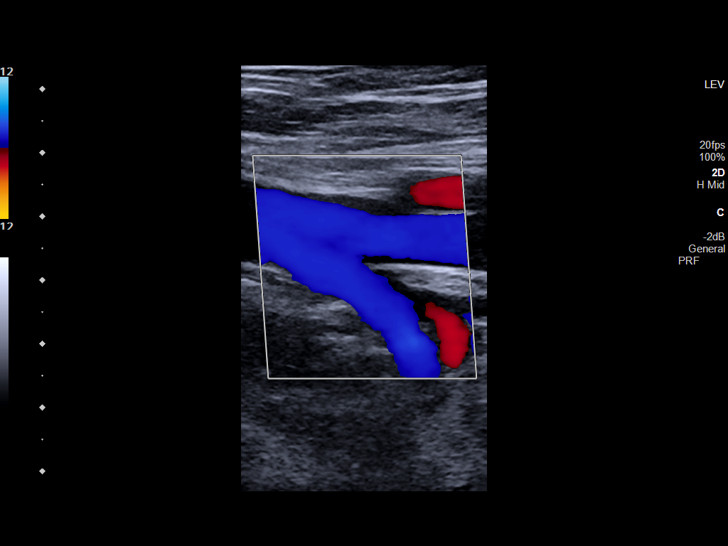
[im 10/26]
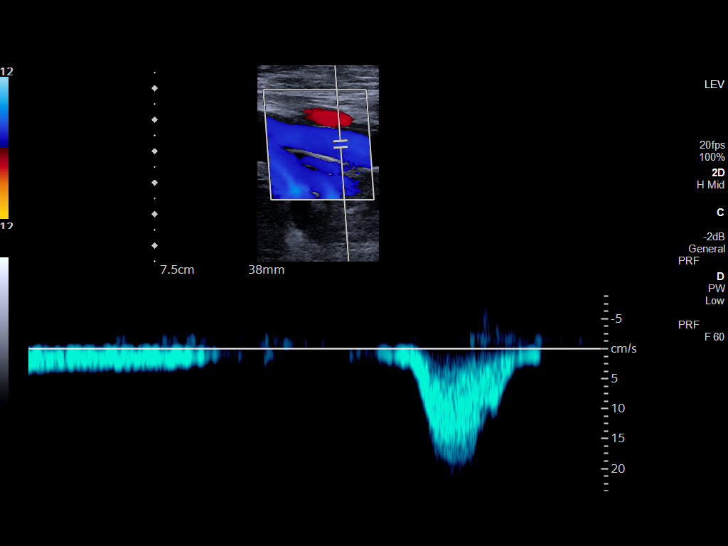
[im 12/26]
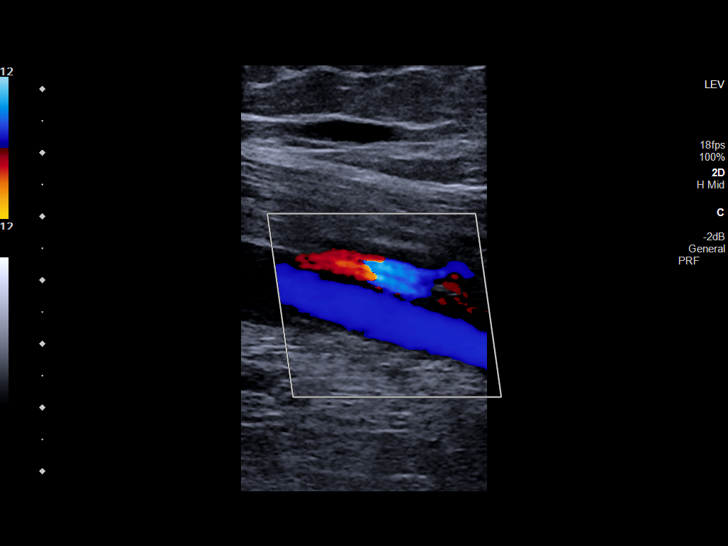
[im 14/26]
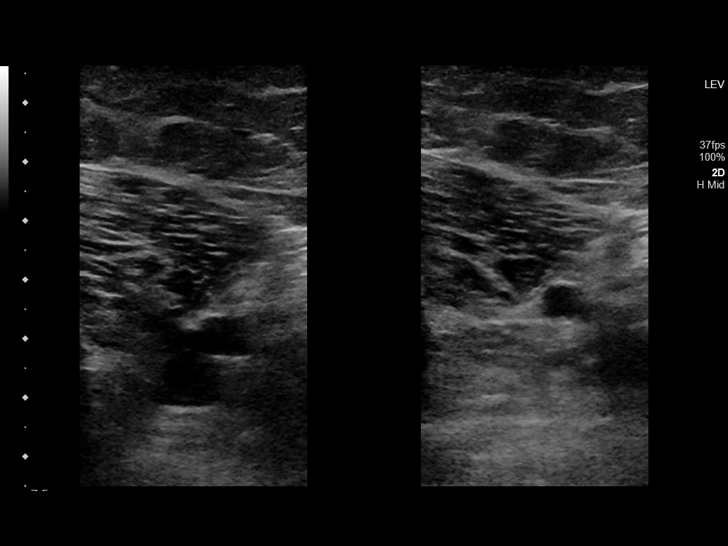
[im 16/26]
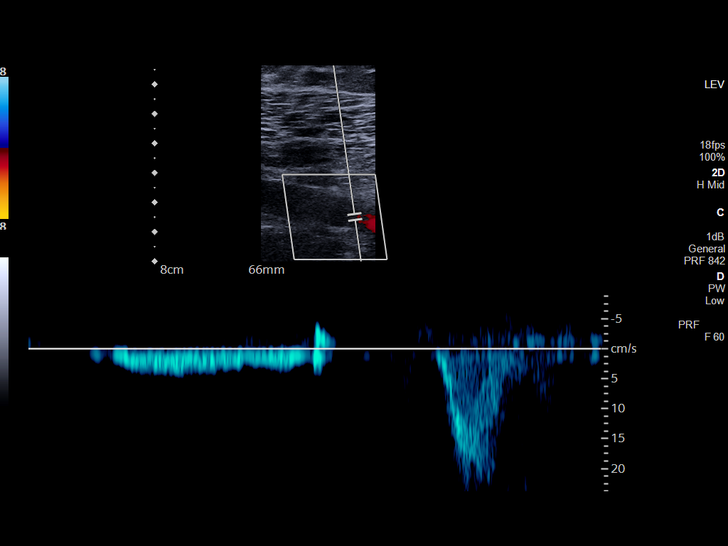
[im 18/26]
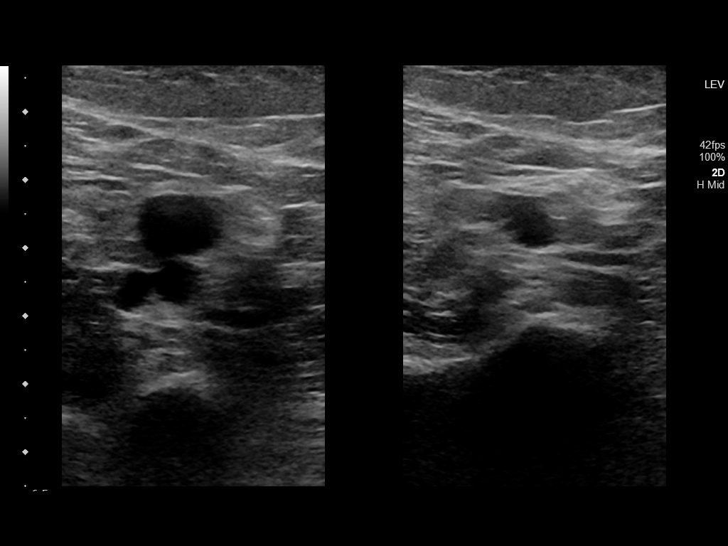
[im 20/26]
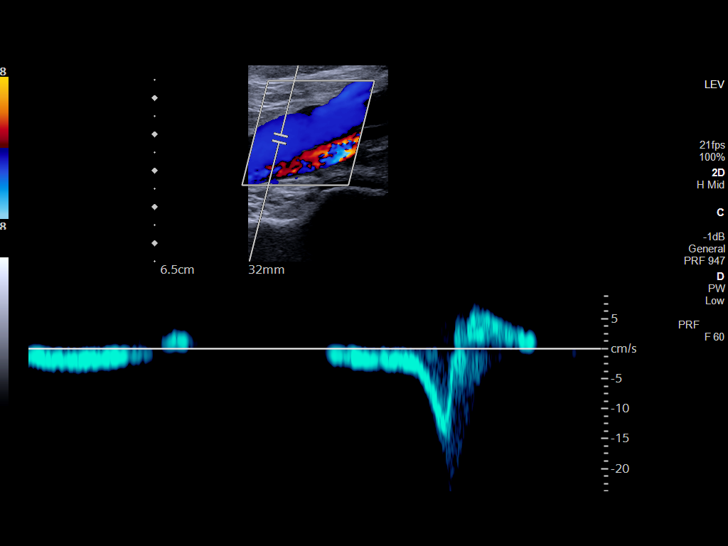
[im 21/26]
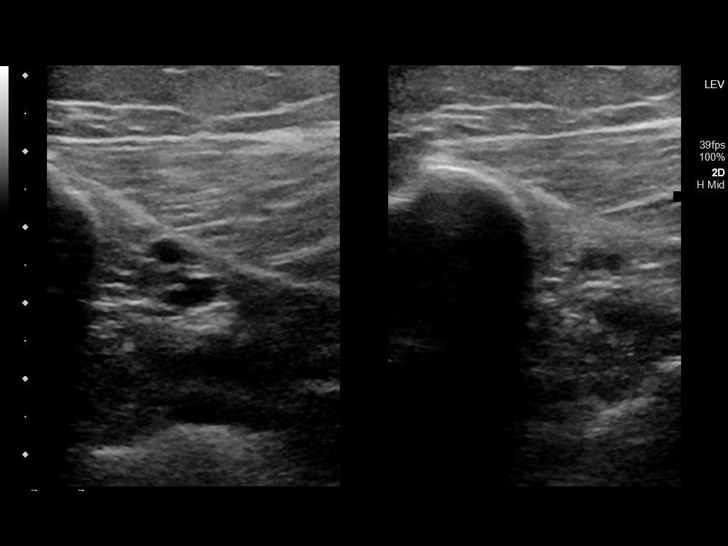
[im 23/26]
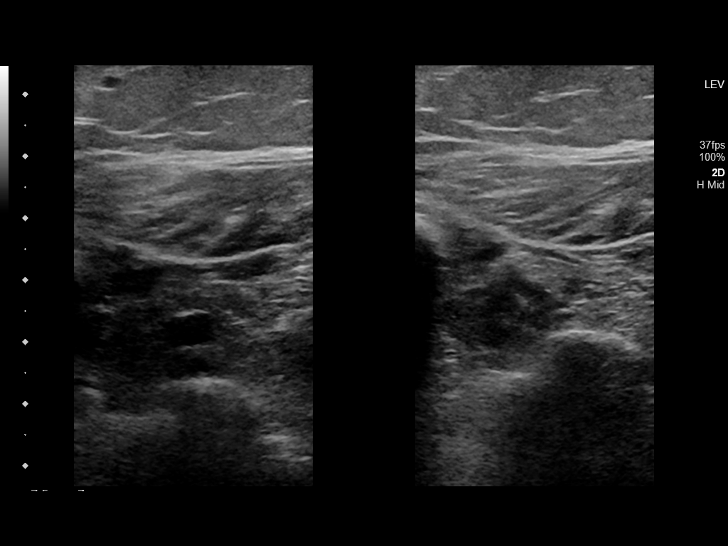
[im 26/26]
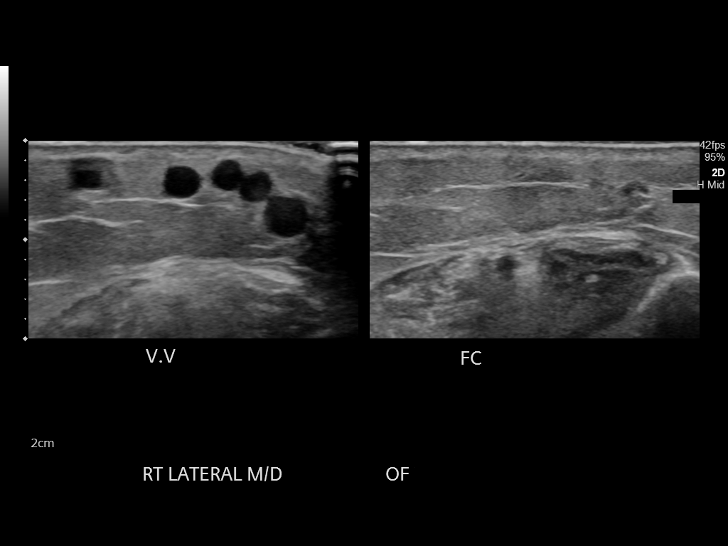

[14 of 24 positions shown; findings below may reference images not displayed]

FINDINGS: VENOUS

Normal compressibility of the common femoral, superficial femoral,
and popliteal veins, as well as the visualized calf veins.
Visualized portions of profunda femoral vein and great saphenous
vein unremarkable. No filling defects to suggest DVT on grayscale or
color Doppler imaging. Doppler waveforms show normal direction of
venous flow, normal respiratory plasticity and response to
augmentation.

OTHER

None.

Limitations: none
IMPRESSION: Negative.

## 2022-04-30 ENCOUNTER — Encounter (INDEPENDENT_AMBULATORY_CARE_PROVIDER_SITE_OTHER): Payer: Self-pay

## 2022-06-19 ENCOUNTER — Other Ambulatory Visit: Payer: Self-pay | Admitting: Internal Medicine

## 2022-06-19 DIAGNOSIS — Z1231 Encounter for screening mammogram for malignant neoplasm of breast: Secondary | ICD-10-CM
